# Patient Record
Sex: Female | Born: 2000 | Race: White | Hispanic: No | Marital: Single | Smoking: Never smoker
Health system: Southern US, Community
[De-identification: ages and names within clinical notes are randomized; demographics above are authoritative.]

## PROBLEM LIST (undated history)

## (undated) DIAGNOSIS — J45909 Unspecified asthma, uncomplicated: Secondary | ICD-10-CM

---
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
## 2018-04-13 ENCOUNTER — Encounter: Payer: Self-pay | Admitting: Pediatrics

## 2018-04-13 ENCOUNTER — Ambulatory Visit (INDEPENDENT_AMBULATORY_CARE_PROVIDER_SITE_OTHER): Payer: Medicaid Other | Admitting: Pediatrics

## 2018-04-13 VITALS — BP 116/83 | HR 77 | Ht 63.48 in | Wt 158.0 lb

## 2018-04-13 DIAGNOSIS — N898 Other specified noninflammatory disorders of vagina: Secondary | ICD-10-CM | POA: Diagnosis not present

## 2018-04-13 DIAGNOSIS — K591 Functional diarrhea: Secondary | ICD-10-CM | POA: Diagnosis not present

## 2018-04-13 DIAGNOSIS — Z3041 Encounter for surveillance of contraceptive pills: Secondary | ICD-10-CM

## 2018-04-13 DIAGNOSIS — Z113 Encounter for screening for infections with a predominantly sexual mode of transmission: Secondary | ICD-10-CM | POA: Diagnosis not present

## 2018-04-13 NOTE — Progress Notes (Signed)
History was provided by the patient.  Madison Schmitt is a 17 y.o. female who is here for vaginal pain.   PCP confirmed? Yes.    Michiel Sites, MD  HPI:  Pain and bleeding during sex. Having some vaginal odor. Pain is deep on the inside. It occurs sometimes. Bleeding is also sometimes. Last time treated for a yeast infection things got better. Denies siginificant discharge or itching. Using condoms sometimes. Denies pain with urination or frequency. Has been starting her periods somewhat earlier before her placebo and is lasting 4-5 days. She has been using Diva cup. Cleans with soap and water at the end of her cycle. Using hand soap.   Been having a spell of diarrhea. She is going about 4 times a day. It has been going on for about a week now. She had the same thing in the beginning of the year that lasted over 1 week. Nothing seems to make it better or worse. She has gas pain right before and then it is just a little bit but feels urgent. It is totally liquid. It is now getting more solid. Denies any nausea but had some vomiting one month ago after eating some seafood. Denies blood in stools. They are a medium brown in color.   Review of Systems  Constitutional: Negative for malaise/fatigue.  Eyes: Negative for double vision.  Respiratory: Negative for shortness of breath.   Cardiovascular: Negative for chest pain and palpitations.  Gastrointestinal: Positive for diarrhea. Negative for abdominal pain, constipation, nausea and vomiting.  Genitourinary: Negative for dysuria, frequency and urgency.  Musculoskeletal: Negative for joint pain and myalgias.  Skin: Negative for rash.  Neurological: Negative for dizziness and headaches.  Endo/Heme/Allergies: Does not bruise/bleed easily.     Patient Active Problem List   Diagnosis Date Noted  . Oral contraceptive pill surveillance 07/28/2014  . Dysmenorrhea treated with oral contraceptive 07/28/2014  . Headache 07/15/2014  . Menorrhagia with  irregular cycle 07/09/2014    Current Outpatient Medications on File Prior to Visit  Medication Sig Dispense Refill  . albuterol (PROVENTIL HFA;VENTOLIN HFA) 108 (90 BASE) MCG/ACT inhaler Inhale into the lungs every 6 (six) hours as needed for wheezing or shortness of breath. 1 Inhaler 2  . levonorgestrel-ethinyl estradiol (AVIANE) 0.1-20 MG-MCG tablet Take 1 tablet by mouth daily. 4 Package 3  . fluconazole (DIFLUCAN) 150 MG tablet Take 1 tablet (150 mg total) by mouth daily. (Patient not taking: Reported on 04/13/2018) 2 tablet 0  . ibuprofen (ADVIL,MOTRIN) 600 MG tablet Take 1 tablet (600 mg total) by mouth every 6 (six) hours as needed for fever or mild pain. (Patient not taking: Reported on 04/13/2018) 30 tablet 0  . loratadine (CLARITIN) 10 MG tablet Take 10 mg by mouth as needed.     . mupirocin ointment (BACTROBAN) 2 % Place 1 application into the nose 2 (two) times daily.     No current facility-administered medications on file prior to visit.     Allergies  Allergen Reactions  . Amoxicillin Hives    Physical Exam:    Vitals:   04/13/18 0920  BP: 116/83  Pulse: 77  Weight: 158 lb (71.7 kg)  Height: 5' 3.48" (1.612 m)    Blood pressure percentiles are not available for patients who are 18 years or older. No LMP recorded.  Physical Exam  Constitutional: She appears well-developed. No distress.  HENT:  Mouth/Throat: Oropharynx is clear and moist.  Neck: No thyromegaly present.  Cardiovascular: Normal rate and regular rhythm.  No murmur heard. Pulmonary/Chest: Breath sounds normal.  Abdominal: Soft. Bowel sounds are normal. She exhibits no distension and no mass. There is no tenderness. There is no guarding.  Musculoskeletal: She exhibits no edema.  Lymphadenopathy:    She has no cervical adenopathy.  Neurological: She is alert.  Skin: Skin is warm. No rash noted.  Psychiatric: She has a normal mood and affect.  Nursing note and vitals reviewed.     Assessment/Plan: 1. Oral contraceptive pill surveillance Doing well with OCPs.   2. Vaginal odor Discussed that she may have yeast vs. BV. Low suspicion for STI. Will get swabs today and call with results. If STI positive or bleeding/pain doesn't clear with treatment of BV, discussed I would like to do pelvic to examine further vs. Doing one today. She agreed to return for pelvic if needed. Discussed cleaning diva cup in boiling water instead of using hand soap which may impact vaginal health.   3. Routine screening for STI (sexually transmitted infection) Per protocol.  - C. trachomatis/N. gonorrhoeae RNA - WET PREP BY MOLECULAR PROBE  4. Diarrhea  Given lack of other sx, likely not infectious. Discussed the episode where she vomited after eating crab once and tuna steak a later time- discussed monitoring reactions when she eats fish. Given that stools are slowly improving, will just continue to monitor for now and return if worsening or blood present.

## 2018-04-14 ENCOUNTER — Other Ambulatory Visit: Payer: Self-pay | Admitting: Pediatrics

## 2018-04-14 ENCOUNTER — Encounter: Payer: Self-pay | Admitting: Pediatrics

## 2018-04-14 DIAGNOSIS — B9689 Other specified bacterial agents as the cause of diseases classified elsewhere: Secondary | ICD-10-CM

## 2018-04-14 DIAGNOSIS — N760 Acute vaginitis: Principal | ICD-10-CM

## 2018-04-14 LAB — C. TRACHOMATIS/N. GONORRHOEAE RNA
C. TRACHOMATIS RNA, TMA: NOT DETECTED
N. gonorrhoeae RNA, TMA: NOT DETECTED

## 2018-04-14 LAB — WET PREP BY MOLECULAR PROBE
CANDIDA SPECIES: NOT DETECTED
MICRO NUMBER:: 90835055
SPECIMEN QUALITY:: ADEQUATE
TRICHOMONAS VAG: NOT DETECTED

## 2018-04-14 MED ORDER — METRONIDAZOLE 500 MG PO TABS: 500.0000 mg | ORAL_TABLET | Freq: Two times a day (BID) | ORAL | 0 refills | Status: AC

## 2018-04-15 ENCOUNTER — Encounter: Payer: Self-pay | Admitting: Pediatrics

## 2018-09-28 ENCOUNTER — Other Ambulatory Visit: Payer: Self-pay | Admitting: Pediatrics

## 2018-09-28 DIAGNOSIS — N921 Excessive and frequent menstruation with irregular cycle: Secondary | ICD-10-CM

## 2018-09-28 MED ORDER — LEVONORGESTREL-ETHINYL ESTRAD 0.1-20 MG-MCG PO TABS: 1.0000 | ORAL_TABLET | Freq: Every day | ORAL | 3 refills | Status: DC

## 2018-11-26 ENCOUNTER — Emergency Department (HOSPITAL_BASED_OUTPATIENT_CLINIC_OR_DEPARTMENT_OTHER)
Admission: EM | Admit: 2018-11-26 | Discharge: 2018-11-26 | Disposition: A | Discharge status: Home / Self Care | Payer: Medicaid Other | Attending: Emergency Medicine | Admitting: Emergency Medicine

## 2018-11-26 ENCOUNTER — Encounter (HOSPITAL_BASED_OUTPATIENT_CLINIC_OR_DEPARTMENT_OTHER): Payer: Self-pay | Admitting: Emergency Medicine

## 2018-11-26 ENCOUNTER — Emergency Department (HOSPITAL_BASED_OUTPATIENT_CLINIC_OR_DEPARTMENT_OTHER): Payer: Medicaid Other

## 2018-11-26 ENCOUNTER — Other Ambulatory Visit: Payer: Self-pay

## 2018-11-26 DIAGNOSIS — Z3202 Encounter for pregnancy test, result negative: Secondary | ICD-10-CM | POA: Insufficient documentation

## 2018-11-26 DIAGNOSIS — J45909 Unspecified asthma, uncomplicated: Secondary | ICD-10-CM | POA: Diagnosis not present

## 2018-11-26 DIAGNOSIS — Y929 Unspecified place or not applicable: Secondary | ICD-10-CM | POA: Diagnosis not present

## 2018-11-26 DIAGNOSIS — S39012A Strain of muscle, fascia and tendon of lower back, initial encounter: Secondary | ICD-10-CM | POA: Insufficient documentation

## 2018-11-26 DIAGNOSIS — S3992XA Unspecified injury of lower back, initial encounter: Secondary | ICD-10-CM | POA: Diagnosis present

## 2018-11-26 DIAGNOSIS — Y939 Activity, unspecified: Secondary | ICD-10-CM | POA: Diagnosis not present

## 2018-11-26 DIAGNOSIS — Y999 Unspecified external cause status: Secondary | ICD-10-CM | POA: Insufficient documentation

## 2018-11-26 DIAGNOSIS — Z79899 Other long term (current) drug therapy: Secondary | ICD-10-CM | POA: Diagnosis not present

## 2018-11-26 DIAGNOSIS — X58XXXA Exposure to other specified factors, initial encounter: Secondary | ICD-10-CM | POA: Diagnosis not present

## 2018-11-26 LAB — URINALYSIS, ROUTINE W REFLEX MICROSCOPIC
Bilirubin Urine: NEGATIVE
GLUCOSE, UA: NEGATIVE mg/dL
Ketones, ur: NEGATIVE mg/dL
Leukocytes,Ua: NEGATIVE
NITRITE: NEGATIVE
Protein, ur: NEGATIVE mg/dL
pH: 6.5 (ref 5.0–8.0)

## 2018-11-26 LAB — URINALYSIS, MICROSCOPIC (REFLEX)

## 2018-11-26 LAB — PREGNANCY, URINE: Preg Test, Ur: NEGATIVE

## 2018-11-26 MED ORDER — DIAZEPAM 5 MG PO TABS: 5.0000 mg | ORAL_TABLET | Freq: Two times a day (BID) | ORAL | 0 refills | Status: DC

## 2018-11-26 MED ORDER — KETOROLAC TROMETHAMINE 30 MG/ML IJ SOLN
30.0000 mg | Freq: Once | INTRAMUSCULAR | Status: AC
  Administered 2018-11-26: 30 mg via INTRAMUSCULAR
  Filled 2018-11-26: qty 1

## 2018-11-26 NOTE — ED Provider Notes (Signed)
[facility] EMERGENCY DEPARTMENT Provider Note   CSN: [id number] Arrival date & time: 11/26/18  1051    History   Chief Complaint Chief Complaint  Patient presents with  . Back Pain    HPI Madison Schmitt is a 18 y.o. female.     Pt presents to the ED today with back pain.  Pt said it has settled in her low back on both sides, right more than left.  No urinary sx.  No pain or numbness in legs.  No trauma.  She has taken tylenol and ibuprofen for sx without relief.  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     Past Medical History:  Diagnosis Date  . Asthma    seasonal     Patient Active Problem List   Diagnosis Date Noted  . Oral contraceptive pill surveillance 07/28/2014  . Dysmenorrhea treated with oral contraceptive 07/28/2014  . Headache 07/15/2014  . Menorrhagia with irregular cycle 07/09/2014    History reviewed. No pertinent surgical history.   OB History   No obstetric history on file.      Home Medications    Prior to Admission medications   Medication Sig Start Date End Date Taking? Authorizing Provider  albuterol (PROVENTIL HFA;VENTOLIN HFA) 108 (90 BASE) MCG/ACT inhaler Inhale into the lungs every 6 (six) hours as needed for wheezing or shortness of breath. 07/15/14   Owens Shark, MD  diazepam (VALIUM) 5 MG tablet Take 1 tablet (5 mg total) by mouth 2 (two) times daily. 11/26/18   Jacalyn Lefevre, MD  levonorgestrel-ethinyl estradiol (AVIANE) 0.1-20 MG-MCG tablet Take 1 tablet by mouth daily. 09/28/18   Verneda Skill, FNP  loratadine (CLARITIN) 10 MG tablet Take 10 mg by mouth as needed.     [provider]  mupirocin ointment (BACTROBAN) 2 % Place 1 application into the nose 2 (two) times daily.    [provider]    Family History Family History  Problem Relation Age of Onset  . Anxiety disorder Mother   . Diabetes Mother     Social History Social History    Tobacco Use  . Smoking status: Never Smoker  . Smokeless tobacco: Never Used  Substance Use Topics  . Alcohol use: No    Alcohol/week: 0.0 standard drinks  . Drug use: Never     Allergies   Amoxicillin   Review of Systems Review of Systems  Musculoskeletal: Positive for back pain.  All other systems reviewed and are negative.    Physical Exam Updated Vital Signs BP 135/86 (BP Location: Left Arm)   Pulse 89   Temp 98.1 F (36.7 C) (Oral)   Resp 20   Ht 5\' 3"  (1.6 m)   Wt 75.3 kg   LMP 11/26/2018   SpO2 95%   BMI 29.41 kg/m   Physical Exam Vitals signs and nursing note reviewed.  Constitutional:      Appearance: Normal appearance.  HENT:     Head: Normocephalic and atraumatic.     Right Ear: External ear normal.     Left Ear: External ear normal.     Nose: Nose normal.     Mouth/Throat:     Mouth: Mucous membranes are moist.  Eyes:     Pupils: Pupils are equal, round, and reactive to light.  Neck:     Musculoskeletal: Normal range of motion and neck supple.  Cardiovascular:     Rate  and Rhythm: Normal rate and regular rhythm.     Pulses: Normal pulses.     Heart sounds: Normal heart sounds.  Pulmonary:     Effort: Pulmonary effort is normal.     Breath sounds: Normal breath sounds.  Abdominal:     General: Abdomen is flat.     Palpations: Abdomen is soft.  Musculoskeletal:       Arms:  Skin:    General: Skin is warm.     Capillary Refill: Capillary refill takes less than 2 seconds.  Neurological:     General: No focal deficit present.     Mental Status: She is alert and oriented to person, place, and time.  Psychiatric:        Mood and Affect: Mood normal.        Behavior: Behavior normal.      ED Treatments / Results  Labs (all labs ordered are listed, but only abnormal results are displayed) Labs Reviewed  URINALYSIS, ROUTINE W REFLEX MICROSCOPIC - Abnormal; Notable for the following components:      Result Value   Specific  Gravity, Urine <1.005 (*)    Hgb urine dipstick MODERATE (*)    All other components within normal limits  URINALYSIS, MICROSCOPIC (REFLEX) - Abnormal; Notable for the following components:   Bacteria, UA MANY (*)    All other components within normal limits  PREGNANCY, URINE    EKG None  Radiology Dg Lumbar Spine Complete  Result Date: 11/26/2018 CLINICAL DATA:  Mid and lower back pain since Saturday. EXAM: LUMBAR SPINE - COMPLETE 4+ VIEW COMPARISON:  None. FINDINGS: There is no evidence of lumbar spine fracture. Alignment is normal. Intervertebral disc spaces are maintained. IMPRESSION: Negative. Electronically Signed   By: Sherian Rein M.D.   On: 11/26/2018 12:07    Procedures Procedures (including critical care time)  Medications Ordered in ED Medications  ketorolac (TORADOL) 30 MG/ML injection 30 mg (30 mg Intramuscular Given 11/26/18 1136)     Initial Impression / Assessment and Plan / ED Course  I have reviewed the triage vital signs and the nursing notes.  Pertinent labs & imaging results that were available during my care of the patient were reviewed by me and considered in my medical decision making (see chart for details).       Sx are MSK in nature.  Toradol is helping.  She will be d/c home with valium to take as needed.  Return if worse.  F/u with pcp.  Final Clinical Impressions(s) / ED Diagnoses   Final diagnoses:  Strain of lumbar region, initial encounter    ED Discharge Orders         Ordered    diazepam (VALIUM) 5 MG tablet  2 times daily     11/26/18 1215           Jacalyn Lefevre, MD 11/26/18 1217

## 2018-11-26 NOTE — ED Triage Notes (Signed)
Reports mid back pain since Saturday without injury.  Denies dysuria, hematuria.

## 2019-04-01 ENCOUNTER — Encounter: Payer: Self-pay | Admitting: Family

## 2019-04-01 ENCOUNTER — Other Ambulatory Visit: Payer: Self-pay

## 2019-04-01 ENCOUNTER — Ambulatory Visit (INDEPENDENT_AMBULATORY_CARE_PROVIDER_SITE_OTHER): Payer: Medicaid Other | Admitting: Family

## 2019-04-01 VITALS — BP 127/88 | HR 76 | Ht 63.9 in | Wt 166.0 lb

## 2019-04-01 DIAGNOSIS — N946 Dysmenorrhea, unspecified: Secondary | ICD-10-CM | POA: Diagnosis not present

## 2019-04-01 DIAGNOSIS — F4323 Adjustment disorder with mixed anxiety and depressed mood: Secondary | ICD-10-CM

## 2019-04-01 DIAGNOSIS — Z793 Long term (current) use of hormonal contraceptives: Secondary | ICD-10-CM

## 2019-04-01 MED ORDER — SERTRALINE HCL 25 MG PO TABS: 25.0000 mg | ORAL_TABLET | Freq: Every day | ORAL | 0 refills | Status: DC

## 2019-04-01 MED ORDER — HYDROXYZINE HCL 10 MG PO TABS: 10.0000 mg | ORAL_TABLET | Freq: Three times a day (TID) | ORAL | 0 refills | Status: DC | PRN

## 2019-04-01 NOTE — Patient Instructions (Addendum)
It was nice to meet you today! Today we discussed starting two medications to help with your anxiety, nausea, and help you feel better.  Both medications have been sent to your pharmacy and you can start them today as prescribed. Take the sertraline in the morning.   Send me a My Chart message in about a week to see how you are doing. I will see you for a video visit when you return from the beach!  Take care and stay well. Here are some things that may help...   Things that can help decrease anxiety...  Apps: [employer]  Websites: InstantPositions.nl Www.socialanxietyinstitute.org  Books: Instant Help Series   04/16/2019 - video visit with me at 10:30

## 2019-04-01 NOTE — Progress Notes (Signed)
History was provided by the patient.  Madison Schmitt is a 18 y.o. female who is here for anxiety.   PCP confirmed? Yes.    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  -she has worked since she was [age] and 2 jobs since she was [age]  -last week she felt like quitting because she just couldn'Schmitt take it any more -she is open to medications, and wants to talk about this today  -her dad takes zoloft with great benefit -she leave for the beach on the 7th, returning on 17th.  -she recognizes her anxiety as sternum pain, nausea, no appetite -she denies si/hi, no cutting   -she is awaiting to find out if she is in [facility] and this has been very stressful for her   Review of Systems  Constitutional: Negative for chills, fever and weight loss.  HENT: Negative for sore throat.   Respiratory: Negative for cough and shortness of breath.   Cardiovascular: Positive for chest pain (with anxiety ) and palpitations (with anxiety ).  Gastrointestinal: Negative for abdominal pain, constipation and diarrhea.  Genitourinary: Negative for dysuria and frequency.  Musculoskeletal: Negative for joint pain and myalgias.  Skin: Negative for rash.  Neurological: Negative for dizziness, tremors and headaches.  Psychiatric/Behavioral: Negative for substance abuse and suicidal ideas. The patient is nervous/anxious.       Patient Active Problem List   Diagnosis Date Noted  . Oral contraceptive pill surveillance 07/28/2014  . Dysmenorrhea treated with oral contraceptive 07/28/2014  . Headache 07/15/2014  . Menorrhagia with irregular cycle 07/09/2014    Current Outpatient Medications on File Prior to Visit  Medication Sig Dispense Refill  . albuterol (PROVENTIL HFA;VENTOLIN HFA) 108 (90 BASE) MCG/ACT inhaler Inhale into the lungs every 6 (six) hours as needed for wheezing or shortness of breath. 1 Inhaler 2  . diazepam (VALIUM) 5 MG  tablet Take 1 tablet (5 mg total) by mouth 2 (two) times daily. 10 tablet 0  . levonorgestrel-ethinyl estradiol (AVIANE) 0.1-20 MG-MCG tablet Take 1 tablet by mouth daily. 4 Package 3  . loratadine (CLARITIN) 10 MG tablet Take 10 mg by mouth as needed.     . mupirocin ointment (BACTROBAN) 2 % Place 1 application into the nose 2 (two) times daily.     No current facility-administered medications on file prior to visit.     Allergies  Allergen Reactions  . Amoxicillin Hives    Physical Exam:    Vitals:   04/01/19 0926  BP: 127/88  Pulse: 76  Weight: 166 lb (75.3 kg)  Height: 5' 3.9" (1.623 m)   Wt Readings from Last 3 Encounters:  04/01/19 166 lb (75.3 kg)  11/26/18 166 lb (75.3 kg) (90 %, Z= 1.29)*  04/13/18 158 lb (71.7 kg) (87 %, Z= 1.13)*   * Growth percentiles are based on CDC (Girls, 2-20 Years) data.    Growth percentile SmartLinks can only be used for patients less than 614 years old. No LMP recorded.  Physical Exam Vitals signs reviewed.  Constitutional:      General: She is not in acute distress.    Appearance: Normal appearance. She is not ill-appearing.  HENT:     Head: Normocephalic.  Eyes:     Extraocular Movements: Extraocular movements intact.     Pupils: Pupils are equal, round, and reactive to light.  Neck:     Musculoskeletal: Normal range  of motion and neck supple.  Cardiovascular:     Rate and Rhythm: Normal rate and regular rhythm.     Heart sounds: No murmur.  Pulmonary:     Effort: Pulmonary effort is normal.  Musculoskeletal: Normal range of motion.  Lymphadenopathy:     Cervical: No cervical adenopathy.  Skin:    General: Skin is warm and dry.     Capillary Refill: Capillary refill takes less than 2 seconds.     Findings: No rash.  Neurological:     General: No focal deficit present.     Mental Status: She is alert.     Motor: No tremor.  Psychiatric:        Attention and Perception: Attention normal.        Mood and Affect: Mood  is anxious.        Behavior: Behavior normal.        Thought Content: Thought content normal.      Assessment/Plan: 1. Adjustment disorder with mixed anxiety and depressed mood -reviewed PHQSADS 9/15/7, elevated anxiety symptoms consistent with her HPI; reviewed SSRI use, efficacy at 4-6 weeks, BBW; since her dad uses sertraline with success, will try this agent first; also reviewed hydroxyzine use for breakthrough anxiety, nausea, and sleep improvement. Discussed not driving while taking hydroxyzine; she will check in by My Chart while at beach and we will have a video visit in 2 weeks  -return precautions given  - hydrOXYzine (ATARAX/VISTARIL) 10 MG tablet; Take 1 tablet (10 mg total) by mouth 3 (three) times daily as needed.  Dispense: 30 tablet; Refill: 0 - sertraline (ZOLOFT) 25 MG tablet; Take 1 tablet (25 mg total) by mouth daily.  Dispense: 30 tablet; Refill: 0  2. Dysmenorrhea treated with oral contraceptive -she is stable on current regimen. Reviewed that there are other options when she is ready to discuss LARC; reviewed condom use and EC as needed.

## 2019-04-03 ENCOUNTER — Encounter: Payer: Self-pay | Admitting: Family

## 2019-04-16 ENCOUNTER — Ambulatory Visit (INDEPENDENT_AMBULATORY_CARE_PROVIDER_SITE_OTHER): Payer: Medicaid Other | Admitting: Family

## 2019-04-16 ENCOUNTER — Encounter: Payer: Self-pay | Admitting: Family

## 2019-04-16 DIAGNOSIS — F4323 Adjustment disorder with mixed anxiety and depressed mood: Secondary | ICD-10-CM | POA: Diagnosis not present

## 2019-04-16 DIAGNOSIS — M62830 Muscle spasm of back: Secondary | ICD-10-CM

## 2019-04-16 MED ORDER — CYCLOBENZAPRINE HCL 10 MG PO TABS: 10.0000 mg | ORAL_TABLET | Freq: Every day | ORAL | 0 refills | Status: DC

## 2019-04-16 NOTE — Progress Notes (Signed)
Virtual Visit via Video Note  I connected with Madison Schmitt  on 04/16/19 at 10:30 AM EDT by a video enabled telemedicine application and verified that I am speaking with the correct person using two identifiers.   Location of patient/parent: in car   I discussed the limitations of evaluation and management by telemedicine and the availability of in person appointments.  I discussed that the purpose of this telehealth visit is to provide medical care while limiting exposure to the novel coronavirus.  The patient expressed understanding and agreed to proceed.  Reason for visit:  Medication follow up   History of Present Illness:  -has been taking zoloft 25 mg every morning  -leaving the beach right now -pretty reserved and quiet and not usually me; she realizes that too  -has been around cousins and is not talking, not engaged; on phone  -has been feeling good otherwise -got into program, hasn't been at work  -did not try hydroxyzine  -no back pain since not working this week; wants to trial flexeril if possible to help with back pain and sleep with work    Observations/Objective:  Smiling, well-appearing, no WOB or NAD   Assessment and Plan:  1. Adjustment disorder with mixed anxiety and depressed mood -d/c sertraline, her description is consistent with that of poor metabolizer and does not seem therapeutic; trial off medication to see if her recent reduction in stress over learning she was accepted into the program has any affect on her mood.   -may use hydroxyzine as needed for anxiety/sleep  2. Back muscle spasm -discussed efficacy of flexeril for muscle spasm; use only at bedtime and not while driving or at work; would benefit from ice/heat intermittent treatments also, 10-15 minutes each once or twice nightly; do not take flexeril more than this one Rx.  - cyclobenzaprine (FLEXERIL) 10 MG tablet; Take 1 tablet (10 mg total) by mouth at bedtime.  Dispense: 30 tablet; Refill:  0    Follow Up Instructions: 2 weeks; she will My Chart message.    I discussed the assessment and treatment plan with the patient and/or parent/guardian. They were provided an opportunity to ask questions and all were answered. They agreed with the plan and demonstrated an understanding of the instructions.   They were advised to call back or seek an in-person evaluation in the emergency room if the symptoms worsen or if the condition fails to improve as anticipated.  I spent 10 minutes on this telehealth visit inclusive of face-to-face video and care coordination time I was located off-site during this encounter.  Madison Ames, NP

## 2019-04-23 ENCOUNTER — Encounter: Payer: Self-pay | Admitting: Family

## 2019-04-27 ENCOUNTER — Encounter: Payer: Self-pay | Admitting: Family

## 2019-04-27 ENCOUNTER — Other Ambulatory Visit: Payer: Self-pay

## 2019-04-27 ENCOUNTER — Ambulatory Visit (INDEPENDENT_AMBULATORY_CARE_PROVIDER_SITE_OTHER): Payer: Medicaid Other | Admitting: Family

## 2019-04-27 DIAGNOSIS — Z0100 Encounter for examination of eyes and vision without abnormal findings: Secondary | ICD-10-CM | POA: Diagnosis not present

## 2019-04-27 DIAGNOSIS — Z01118 Encounter for examination of ears and hearing with other abnormal findings: Secondary | ICD-10-CM

## 2019-04-27 NOTE — Progress Notes (Signed)
Pt here today for vitals check. Collaborated with NP- plan of care made.Form completed and copied per protocol.

## 2019-04-27 NOTE — Progress Notes (Signed)
Wt Readings from Last 3 Encounters:  04/27/19 167 lb 3.2 oz (75.8 kg)  04/01/19 166 lb (75.3 kg)  11/26/18 166 lb (75.3 kg) (90 %, Z= 1.29)*   * Growth percentiles are based on CDC (Girls, 2-20 Years) data.   Temp Readings from Last 3 Encounters:  11/26/18 98.1 F (36.7 C) (Oral)  04/06/15 98.5 F (36.9 C) (Oral)  02/25/15 98.4 F (36.9 C) (Oral)   BP Readings from Last 3 Encounters:  04/01/19 127/88  11/26/18 135/86  04/13/18 116/83   Pulse Readings from Last 3 Encounters:  04/01/19 76  11/26/18 89  04/13/18 77    Review of Systems  Constitutional: Negative for fever, malaise/fatigue and weight loss.  HENT: Negative for ear pain, hearing loss and tinnitus.   Eyes: Negative for blurred vision and double vision.  Respiratory: Negative for cough and shortness of breath.   Cardiovascular: Negative for chest pain and palpitations.  Gastrointestinal: Negative for abdominal pain and vomiting.  Genitourinary: Negative for dysuria and frequency.  Musculoskeletal: Negative for joint pain and myalgias.  Neurological: Negative for dizziness and headaches.  Psychiatric/Behavioral: Negative for depression. The patient is not nervous/anxious.     Patient Active Problem List   Diagnosis Date Noted  . Oral contraceptive pill surveillance 07/28/2014  . Dysmenorrhea treated with oral contraceptive 07/28/2014  . Headache 07/15/2014  . Menorrhagia with irregular cycle 07/09/2014    Current Outpatient Medications on File Prior to Visit  Medication Sig Dispense Refill  . cyclobenzaprine (FLEXERIL) 10 MG tablet Take 1 tablet (10 mg total) by mouth at bedtime. 30 tablet 0  . hydrOXYzine (ATARAX/VISTARIL) 10 MG tablet Take 1 tablet (10 mg total) by mouth 3 (three) times daily as needed. 30 tablet 0  . levonorgestrel-ethinyl estradiol (AVIANE) 0.1-20 MG-MCG tablet Take 1 tablet by mouth daily. 4 Package 3  . albuterol (PROVENTIL HFA;VENTOLIN HFA) 108 (90 BASE) MCG/ACT inhaler Inhale into  the lungs every 6 (six) hours as needed for wheezing or shortness of breath. 1 Inhaler 2  . diazepam (VALIUM) 5 MG tablet Take 1 tablet (5 mg total) by mouth 2 (two) times daily. (Patient not taking: Reported on 04/27/2019) 10 tablet 0  . loratadine (CLARITIN) 10 MG tablet Take 10 mg by mouth as needed.     . mupirocin ointment (BACTROBAN) 2 % Place 1 application into the nose 2 (two) times daily.     No current facility-administered medications on file prior to visit.     Allergies  Allergen Reactions  . Amoxicillin Hives    Physical Exam:    Vitals:   04/27/19 1524  BP: 129/89  Pulse: 98  Weight: 167 lb 3.2 oz (75.8 kg)  Height: 5' 3.19" (1.605 m)    Growth percentile SmartLinks can only be used for patients less than 20 years old. No LMP recorded.  Physical Exam Constitutional:      Appearance: Normal appearance.  HENT:     Head: Normocephalic.     Right Ear: Tympanic membrane and ear canal normal.     Left Ear: Tympanic membrane and ear canal normal.     Nose: Nose normal.     Mouth/Throat:     Mouth: Mucous membranes are moist.     Pharynx: No oropharyngeal exudate.  Eyes:     Extraocular Movements: Extraocular movements intact.     Pupils: Pupils are equal, round, and reactive to light.  Neck:     Musculoskeletal: Normal range of motion.  Cardiovascular:  Rate and Rhythm: Normal rate and regular rhythm.     Heart sounds: No murmur.  Pulmonary:     Effort: Pulmonary effort is normal.  Musculoskeletal: Normal range of motion.        General: No swelling.  Lymphadenopathy:     Cervical: No cervical adenopathy.  Skin:    General: Skin is warm and dry.     Findings: No rash.  Neurological:     General: No focal deficit present.     Mental Status: She is alert and oriented to person, place, and time.  Psychiatric:        Mood and Affect: Mood normal.     Assessment/Plan: 1. Encounter for hearing screening with abnormal findings  -will recheck hearing  at next follow up. Forms completed and given to patient.   2. Encounter for vision examination without abnormal findings -normal vision screening

## 2019-06-30 ENCOUNTER — Encounter: Payer: Self-pay | Admitting: Pediatrics

## 2019-06-30 ENCOUNTER — Ambulatory Visit (INDEPENDENT_AMBULATORY_CARE_PROVIDER_SITE_OTHER): Payer: Medicaid Other | Admitting: Pediatrics

## 2019-06-30 VITALS — BP 128/96 | HR 100 | Ht 63.19 in | Wt 168.4 lb

## 2019-06-30 DIAGNOSIS — N921 Excessive and frequent menstruation with irregular cycle: Secondary | ICD-10-CM

## 2019-06-30 DIAGNOSIS — L708 Other acne: Secondary | ICD-10-CM

## 2019-06-30 DIAGNOSIS — F4322 Adjustment disorder with anxiety: Secondary | ICD-10-CM

## 2019-06-30 DIAGNOSIS — Z3041 Encounter for surveillance of contraceptive pills: Secondary | ICD-10-CM | POA: Diagnosis not present

## 2019-06-30 DIAGNOSIS — Z793 Long term (current) use of hormonal contraceptives: Secondary | ICD-10-CM

## 2019-06-30 DIAGNOSIS — I10 Essential (primary) hypertension: Secondary | ICD-10-CM

## 2019-06-30 DIAGNOSIS — N946 Dysmenorrhea, unspecified: Secondary | ICD-10-CM | POA: Diagnosis not present

## 2019-06-30 MED ORDER — CLINDAMYCIN PHOS-BENZOYL PEROX 1-5 % EX GEL: CUTANEOUS | 11 refills | Status: DC

## 2019-06-30 MED ORDER — LEVONORGESTREL-ETHINYL ESTRAD 0.1-20 MG-MCG PO TABS: 1.0000 | ORAL_TABLET | Freq: Every day | ORAL | 3 refills | Status: DC

## 2019-06-30 NOTE — Progress Notes (Signed)
THIS RECORD MAY CONTAIN CONFIDENTIAL INFORMATION THAT SHOULD NOT BE RELEASED WITHOUT REVIEW OF THE SERVICE PROVIDER.  Virtual Follow-Up Visit via Video Note  I connected with Madison Schmitt 's patient  on 06/30/19 at  1:30 PM EDT by a video enabled telemedicine application and verified that I am speaking with the correct person using two identifiers.    This patient visit was completed through the use of an audio/video or telephone encounter in the setting of the State of Emergency due to the COVID-19 Pandemic.  I discussed that the purpose of this telehealth visit is to provide medical care while limiting exposure to the novel coronavirus.       I discussed the limitations of evaluation and management by telemedicine and the availability of in person appointments.    The patient expressed understanding and agreed to proceed.   The patient was physically located in clinic in [place] or a state in which I am permitted to provide care. The patient and/or parent/guardian understood that s/he may incur co-pays and cost sharing, and agreed to the telemedicine visit. The visit was reasonable and appropriate under the circumstances given the patient's presentation at the time.   The patient and/or parent/guardian has been advised of the potential risks and limitations of this mode of treatment (including, but not limited to, the absence of in-person examination) and has agreed to be treated using telemedicine. The patient's/patient's family's questions regarding telemedicine have been answered.    As this visit was completed in an ambulatory virtual setting, the patient and/or parent/guardian has also been advised to contact their provider's office for worsening conditions, and seek emergency medical treatment and/or call 911 if the patient deems either necessary.     Madison Schmitt is a [age] y.o. female referred by [name], Candida Vetter T, FNP here today for follow-up of anxious mood, nausea and OCP  maintenance.    Growth Chart Viewed? not applicable  Previsit planning completed:  yes   History was provided by the patient.  PCP Confirmed?  yes  My Chart Activated?   yes    Plan from Last Visit:   D/c sertraline, continue to monitor   Chief Complaint: Mood changes   History of Present Illness:  She was having a lot of anxiety and uncertainty with not knowing about her program stuff. She got put on zoloft and that didn't work out for her. She got into her program and she has felt much better and not needed medication.  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 She thought she wanted to do nursing but this is great.   Nausea has resolved and was r/t mood issues.   Anxiety is 1-2 but well managed. Was trying to do some physical activity to help.   Denies headaches.   Large zit on right side of face from mask   Patient's last menstrual period was 06/09/2019.  Review of Systems  Constitutional: Negative for malaise/fatigue.  HENT: Negative for sore throat.   Eyes: Negative for double vision.  Respiratory: Negative for shortness of breath.   Cardiovascular: Negative for chest pain and palpitations.  Gastrointestinal: Negative for abdominal pain, constipation, diarrhea, nausea and vomiting.  Genitourinary: Negative for dysuria.  Musculoskeletal: Negative for joint pain and myalgias.  Skin: Negative for rash.  Neurological: Negative for dizziness and headaches.  Endo/Heme/Allergies: Does not bruise/bleed easily.  Psychiatric/Behavioral: Negative for depression. The patient is not nervous/anxious and does not have insomnia.  Allergies  Allergen Reactions  . Amoxicillin Hives   Outpatient Medications Prior to Visit  Medication Sig Dispense Refill  . albuterol (PROVENTIL HFA;VENTOLIN HFA) 108 (90 BASE) MCG/ACT inhaler Inhale into the lungs every 6 (six) hours as needed for wheezing or shortness of breath. 1 Inhaler 2  . cyclobenzaprine  (FLEXERIL) 10 MG tablet Take 1 tablet (10 mg total) by mouth at bedtime. 30 tablet 0  . hydrOXYzine (ATARAX/VISTARIL) 10 MG tablet Take 1 tablet (10 mg total) by mouth 3 (three) times daily as needed. 30 tablet 0  . levonorgestrel-ethinyl estradiol (AVIANE) 0.1-20 MG-MCG tablet Take 1 tablet by mouth daily. 4 Package 3  . mupirocin ointment (BACTROBAN) 2 % Place 1 application into the nose 2 (two) times daily.    . diazepam (VALIUM) 5 MG tablet Take 1 tablet (5 mg total) by mouth 2 (two) times daily. (Patient not taking: Reported on 04/27/2019) 10 tablet 0  . loratadine (CLARITIN) 10 MG tablet Take 10 mg by mouth as needed.      No facility-administered medications prior to visit.      Patient Active Problem List   Diagnosis Date Noted  . Oral contraceptive pill surveillance 07/28/2014  . Dysmenorrhea treated with oral contraceptive 07/28/2014  . Headache 07/15/2014  . Menorrhagia with irregular cycle 07/09/2014    Past Medical History:  Reviewed and updated?  yes Past Medical History:  Diagnosis Date  . Asthma    seasonal     Family History: Reviewed and updated? yes Family History  Problem Relation Age of Onset  . Anxiety disorder Mother   . Diabetes Mother     Confidentiality was discussed with the patient and if applicable, with caregiver as well.  Enter confidential phone number in Family Comments section of [employer] Tobacco?  no Drugs/ETOH?  no Partner preference?  female Sexually Active?  no  Pregnancy Prevention:  birth control pills, reviewed condoms & plan B Trauma currently or in the pastt?  no Suicidal or Self-Harm thoughts?   no  The following portions of the patient's history were reviewed and updated as appropriate: allergies, current medications, past family history, past medical history, past social history, past surgical history and problem list.  Visual Observations/Objective:   General Appearance: Well nourished well developed, in no apparent distress.   Eyes: conjunctiva no swelling or erythema ENT/Mouth: No hoarseness, No cough for duration of visit.  Neck: Supple  Respiratory: Respiratory effort normal, normal rate, no retractions or distress.   Cardio: Appears well-perfused, noncyanotic Musculoskeletal: no obvious deformity Skin: visible skin with a large raised, red lesion to R cheek.  Neuro: Awake and oriented X 3,  Psych:  normal affect, Insight and Judgment appropriate.    Assessment/Plan: 1. Menorrhagia with irregular cycle Continue OCP as prescribed. This has been going well for her.  - levonorgestrel-ethinyl estradiol (AVIANE) 0.1-20 MG-MCG tablet; Take 1 tablet by mouth daily.  Dispense: 4 Package; Refill: 3  2. Dysmenorrhea treated with oral contraceptive As above.   3. Oral contraceptive pill surveillance BP has been elevated in the past  4. Other acne Large lesion to R cheek. Discussed starting benzaclin tonight and if not improved in a few days to let me know by mychart as she may need a PO antibiotic based on how large and swollen the area is.  - clindamycin-benzoyl peroxide (BENZACLIN) gel; Apply topically every morning.  Dispense: 50 g; Refill: 11  5. Elevated BP  Blood pressure continues to be elevated on checks in the office.  I have asked her to do some serial checks at home so we can get an idea of where it is not in the office. May need additional labs and discussion of lifestyle changes +/- medication therapy.   I discussed the assessment and treatment plan with the patient and/or parent/guardian.  They were provided an opportunity to ask questions and all were answered.  They agreed with the plan and demonstrated an understanding of the instructions. They were advised to call back or seek an in-person evaluation in the emergency room if the symptoms worsen or if the condition fails to improve as anticipated.   Follow-up:   May for 18 yo PE and PAP or sooner as needed   Medical decision-making:   I  spent 25 minutes on this telehealth visit inclusive of face-to-face video and care coordination time I was located off site during this encounter.   Alfonso Ramus, FNP    CC: Verneda Skill, FNP, Verneda Skill, FNP

## 2019-07-19 ENCOUNTER — Other Ambulatory Visit: Payer: Self-pay

## 2019-07-19 ENCOUNTER — Encounter: Payer: Self-pay | Admitting: Pediatrics

## 2019-07-19 ENCOUNTER — Ambulatory Visit (INDEPENDENT_AMBULATORY_CARE_PROVIDER_SITE_OTHER): Payer: Medicaid Other | Admitting: Pediatrics

## 2019-07-19 VITALS — BP 133/94 | HR 98 | Ht 63.39 in | Wt 168.4 lb

## 2019-07-19 DIAGNOSIS — I10 Essential (primary) hypertension: Secondary | ICD-10-CM | POA: Diagnosis not present

## 2019-07-19 DIAGNOSIS — Z111 Encounter for screening for respiratory tuberculosis: Secondary | ICD-10-CM

## 2019-07-19 DIAGNOSIS — L700 Acne vulgaris: Secondary | ICD-10-CM

## 2019-07-19 MED ORDER — CLINDAMYCIN PHOS-BENZOYL PEROX 1.2-5 % EX GEL: 1.0000 "application " | Freq: Two times a day (BID) | CUTANEOUS | 0 refills | Status: DC

## 2019-07-19 MED ORDER — TRETINOIN 0.01 % EX GEL: Freq: Every day | CUTANEOUS | 0 refills | Status: DC

## 2019-07-19 NOTE — Progress Notes (Addendum)
THIS RECORD MAY CONTAIN CONFIDENTIAL INFORMATION THAT SHOULD NOT BE RELEASED WITHOUT REVIEW OF THE SERVICE PROVIDER.  Adolescent Medicine Consultation Follow-Up Visit Madison Schmitt  is a 18 y.o. female referred by Trude Mcburney, FNP here today for follow-up regarding hypertension follow up.    Plan at last adolescent specialty clinic  visit included -- home BP monitoring, return for labs.  Pertinent Labs? No Growth Chart Viewed? not applicable   History was provided by the patient.  Interpreter? no  Chief Complaint  Patient presents with  . Follow-up  . Depression    HPI:   PCP Confirmed?  yes  My Chart Activated?   yes  Patient's personal or confidential phone number: not obtained  Previously on sertraline for depressed mood -- no benefit. Discontinued 04/16/19.  Here today for hypertension follow up.   Hypertension: Since last visit, has been going on 30 min walk several times per week and reduced intake of high salt foods. Home BP log provided:     Fam Hx of hypertension in father, grandparents on both sides. Sibling being worked up for HTN. Mother with T2DM.  Denies EtOH, smoking, NSAID, nighttime snoring or daytime fatigue. No changes in vision, abdominal pain, vomiting, diarrhea, changes in urine volume or color.   Mood has been good. Denies SI, HI, significant stress or anxiety.  Acne: Continues to have a zit on her R cheek. Zit popped, now not getting better or worse.Benzaclin ordered at last visit, unable to pick up from pharmacy, told there was a problem on [facility]'s end.  No LMP recorded. Allergies  Allergen Reactions  . Amoxicillin Hives   Current Outpatient Medications on File Prior to Visit  Medication Sig Dispense Refill  . cyclobenzaprine (FLEXERIL) 10 MG tablet Take 1 tablet (10 mg total) by mouth at bedtime. 30 tablet 0  . levonorgestrel-ethinyl estradiol (AVIANE) 0.1-20 MG-MCG tablet Take 1 tablet by mouth daily. 4 Package 3  . albuterol  (PROVENTIL HFA;VENTOLIN HFA) 108 (90 BASE) MCG/ACT inhaler Inhale into the lungs every 6 (six) hours as needed for wheezing or shortness of breath. 1 Inhaler 2  . clindamycin-benzoyl peroxide (BENZACLIN) gel Apply topically every morning. (Patient not taking: Reported on 07/19/2019) 50 g 11  . hydrOXYzine (ATARAX/VISTARIL) 10 MG tablet Take 1 tablet (10 mg total) by mouth 3 (three) times daily as needed. (Patient not taking: Reported on 07/19/2019) 30 tablet 0  . mupirocin ointment (BACTROBAN) 2 % Place 1 application into the nose 2 (two) times daily.     No current facility-administered medications on file prior to visit.     Patient Active Problem List   Diagnosis Date Noted  . Hypertension 07/19/2019  . Oral contraceptive pill surveillance 07/28/2014  . Dysmenorrhea treated with oral contraceptive 07/28/2014  . Headache 07/15/2014  . Menorrhagia with irregular cycle 07/09/2014    Social History: Changes with school since last visit?  no  Lifestyle habits that can impact QOL: As above  Confidentiality was discussed with the patient and if applicable, with caregiver as well.  Suicidal or homicidal thoughts?   no Self injurious behaviors?  No  The following portions of the patient's history were reviewed and updated as appropriate: allergies, current medications, past family history, past medical history, past social history, past surgical history and problem list.  Physical Exam:  Vitals:   07/19/19 0854  BP: (!) 133/94  Pulse: 98  Weight: 168 lb 6.4 oz (76.4 kg)  Height: 5' 3.39" (1.61 m)   BP (!) 133/94  Pulse 98   Ht 5' 3.39" (1.61 m)   Wt 168 lb 6.4 oz (76.4 kg)   BMI 29.47 kg/m  Body mass index: body mass index is 29.47 kg/m. Growth percentile SmartLinks can only be used for patients less than 64 years old.   Physical Exam Constitutional:      General: She is not in acute distress.    Appearance: Normal appearance.  HENT:     Head: Normocephalic and  atraumatic.     Right Ear: External ear normal.     Left Ear: External ear normal.     Nose: Nose normal. No congestion.     Mouth/Throat:     Mouth: Mucous membranes are moist.     Pharynx: No oropharyngeal exudate or posterior oropharyngeal erythema.     Comments: No tonsila hypertrophy Eyes:     Extraocular Movements: Extraocular movements intact.  Neck:     Musculoskeletal: Normal range of motion and neck supple. No muscular tenderness.     Comments: No masses Cardiovascular:     Rate and Rhythm: Normal rate and regular rhythm.     Pulses: Normal pulses.     Heart sounds: Normal heart sounds. No murmur. No gallop.   Pulmonary:     Effort: Pulmonary effort is normal.     Breath sounds: Normal breath sounds.  Abdominal:     General: Abdomen is flat. Bowel sounds are normal.     Palpations: Abdomen is soft.  Musculoskeletal: Normal range of motion.        General: No swelling or tenderness.  Lymphadenopathy:     Cervical: No cervical adenopathy.  Skin:    General: Skin is warm and dry.     Capillary Refill: Capillary refill takes less than 2 seconds.  Neurological:     General: No focal deficit present.     Mental Status: She is alert and oriented to person, place, and time.  Psychiatric:        Mood and Affect: Mood normal.        Behavior: Behavior normal.        Thought Content: Thought content normal.        Judgment: Judgment normal.    Assessment/Plan:  1. Hypertension, unspecified type Likely primary HTN. Persistent at home on daily monitoring, as above. Asymptomatic, PE benign. Pt prefers to hold off on medications today, will continue exercise (walking) and start on DASH diet. Does take estrogen containing OCP, but has controlled dysmenorrhea for several years and prefers not to switch. If lab abnormalities or HTN resistant to lifestyle modification, pt understands that starting medication may be necessary. - Comprehensive metabolic panel - HDL cholesterol -  Hemoglobin A1c - Cholesterol, total - Lipid panel - TSH - T4, free - VITAMIN D 25 Hydroxy (Vit-D Deficiency, Fractures) - Urine Microscopic - Protein / creatinine ratio, urine - CBC - POCT Glucose (Device for Home Use) - EKG 12-Lead - QuantiFERON-TB Gold Plus  2. Tuberculosis screening Requested for work  3. Acne vulgaris As above. - tretinoin (RETIN-A) 0.01 % gel; Apply topically at bedtime.  Dispense: 45 g; Refill: 0  Follow-up:  No follow-ups on file.   Medical decision-making:  >20 minutes spent face to face with patient with more than 50% of appointment spent discussing diagnosis, management, follow-up, and reviewing of hypertension.

## 2019-07-19 NOTE — Patient Instructions (Addendum)
Please call [phone] to schedule an ECG. Please continue to monitor and record your home BP once per week. We will call to discuss your lab results if there are any abnormalities.

## 2019-07-19 NOTE — Progress Notes (Signed)
I have reviewed the resident's note and plan of care and helped develop the plan as necessary.  Madison Schmitt has an extensive family history of hypertension. Her sister is also going through a workup for hypertension now, so it is likely that in the near future she will require medication management, however, I support her desire to try lifestyle management first. We will follow up in 3 months pending labs.   Jonathon Resides, FNP

## 2019-07-20 LAB — COMPREHENSIVE METABOLIC PANEL
AG Ratio: 1.7 (calc) (ref 1.0–2.5)
ALT: 12 U/L (ref 6–29)
AST: 14 U/L (ref 10–30)
Albumin: 4.5 g/dL (ref 3.6–5.1)
Alkaline phosphatase (APISO): 54 U/L (ref 31–125)
BUN: 8 mg/dL (ref 7–25)
CO2: 25 mmol/L (ref 20–32)
Calcium: 9.7 mg/dL (ref 8.6–10.2)
Chloride: 105 mmol/L (ref 98–110)
Creat: 0.66 mg/dL (ref 0.50–1.10)
Globulin: 2.7 g/dL (calc) (ref 1.9–3.7)
Glucose, Bld: 120 mg/dL — ABNORMAL HIGH (ref 65–99)
Potassium: 4.6 mmol/L (ref 3.5–5.3)
Sodium: 139 mmol/L (ref 135–146)
Total Bilirubin: 0.4 mg/dL (ref 0.2–1.2)
Total Protein: 7.2 g/dL (ref 6.1–8.1)

## 2019-07-20 LAB — HEMOGLOBIN A1C
Hgb A1c MFr Bld: 5.2 % of total Hgb (ref <5.7)
Mean Plasma Glucose: 103 (calc)
eAG (mmol/L): 5.7 (calc)

## 2019-07-20 LAB — URINALYSIS, MICROSCOPIC ONLY
Bacteria, UA: NONE SEEN /HPF
Hyaline Cast: NONE SEEN /LPF
RBC / HPF: NONE SEEN /HPF (ref 0–2)
Squamous Epithelial / HPF: NONE SEEN /HPF (ref <=5)
WBC, UA: NONE SEEN /HPF (ref 0–5)

## 2019-07-20 LAB — CBC
HCT: 43.9 % (ref 35.0–45.0)
Hemoglobin: 15.2 g/dL (ref 11.7–15.5)
MCH: 33.9 pg — ABNORMAL HIGH (ref 27.0–33.0)
MCHC: 34.6 g/dL (ref 32.0–36.0)
MCV: 97.8 fL (ref 80.0–100.0)
MPV: 10 fL (ref 7.5–12.5)
Platelets: 240 10*3/uL (ref 140–400)
RBC: 4.49 10*6/uL (ref 3.80–5.10)
RDW: 12 % (ref 11.0–15.0)
WBC: 5.8 10*3/uL (ref 3.8–10.8)

## 2019-07-20 LAB — LIPID PANEL
Cholesterol: 193 mg/dL (ref <200)
HDL: 57 mg/dL (ref >=50)
LDL Cholesterol (Calc): 119 mg/dL (calc) — ABNORMAL HIGH
Non-HDL Cholesterol (Calc): 136 mg/dL (calc) — ABNORMAL HIGH (ref <130)
Total CHOL/HDL Ratio: 3.4 (calc) (ref <5.0)
Triglycerides: 78 mg/dL (ref <150)

## 2019-07-20 LAB — VITAMIN D 25 HYDROXY (VIT D DEFICIENCY, FRACTURES): Vit D, 25-Hydroxy: 23 ng/mL — ABNORMAL LOW (ref 30–100)

## 2019-07-20 LAB — PROTEIN / CREATININE RATIO, URINE
Creatinine, Urine: 30 mg/dL (ref 20–275)
Protein/Creat Ratio: 133 mg/g creat (ref 21–161)
Protein/Creatinine Ratio: 0.133 mg/mg creat (ref 0.021–0.16)
Total Protein, Urine: 4 mg/dL — ABNORMAL LOW (ref 5–24)

## 2019-07-20 LAB — TSH: TSH: 2.11 mIU/L

## 2019-07-20 LAB — T4, FREE: Free T4: 1.2 ng/dL (ref 0.8–1.4)

## 2019-07-21 LAB — QUANTIFERON-TB GOLD PLUS
Mitogen-NIL: >10 IU/mL
NIL: 0.02 IU/mL
QuantiFERON-TB Gold Plus: NEGATIVE
TB1-NIL: 0.01 IU/mL
TB2-NIL: 0.02 IU/mL

## 2019-10-20 ENCOUNTER — Other Ambulatory Visit: Payer: Self-pay

## 2019-10-20 ENCOUNTER — Telehealth (INDEPENDENT_AMBULATORY_CARE_PROVIDER_SITE_OTHER): Payer: Medicaid Other | Admitting: Pediatrics

## 2019-10-20 DIAGNOSIS — Z793 Long term (current) use of hormonal contraceptives: Secondary | ICD-10-CM | POA: Diagnosis not present

## 2019-10-20 DIAGNOSIS — I10 Essential (primary) hypertension: Secondary | ICD-10-CM

## 2019-10-20 DIAGNOSIS — N946 Dysmenorrhea, unspecified: Secondary | ICD-10-CM | POA: Diagnosis not present

## 2019-10-20 NOTE — Progress Notes (Signed)
THIS RECORD MAY CONTAIN CONFIDENTIAL INFORMATION THAT SHOULD NOT BE RELEASED WITHOUT REVIEW OF THE SERVICE PROVIDER.  Virtual Follow-Up Visit via Video Note  I connected with Madison Schmitt 's patient  on 10/20/19 at  4:00 PM EST by a video enabled telemedicine application and verified that I am speaking with the correct person using two identifiers.    This patient visit was completed through the use of an audio/video or telephone encounter in the setting of the State of Emergency due to the COVID-19 Pandemic.  I discussed that the purpose of this telehealth visit is to provide medical care while limiting exposure to the novel coronavirus.       I discussed the limitations of evaluation and management by telemedicine and the availability of in person appointments.    The patient expressed understanding and agreed to proceed.   The patient was physically located at home in [place] or a state in which I am permitted to provide care. The patient and/or parent/guardian understood that s/he may incur co-pays and cost sharing, and agreed to the telemedicine visit. The visit was reasonable and appropriate under the circumstances given the patient's presentation at the time.   The patient and/or parent/guardian has been advised of the potential risks and limitations of this mode of treatment (including, but not limited to, the absence of in-person examination) and has agreed to be treated using telemedicine. The patient's/patient's family's questions regarding telemedicine have been answered.    As this visit was completed in an ambulatory virtual setting, the patient and/or parent/guardian has also been advised to contact their provider's office for worsening conditions, and seek emergency medical treatment and/or call 911 if the patient deems either necessary.   Team Care Documentation:  Team care member assisted with documentation during this visit? no If applicable, list name(s) of team care  members and location(s) of team care members: No   Madison Schmitt is a 19 y.o. female referred by Verneda Skill, FNP here today for follow-up of hypertension, ocp.   Growth Chart Viewed? not applicable  Previsit planning completed:  yes   History was provided by the patient.  PCP Confirmed?  yes  My Chart Activated?   yes    Plan from Last Visit:   Lifestyle changes and monitor BP   Chief Complaint: BP follow up   History of Present Illness:  Been doing well. In school, feels good about her blood pressure. Not eating as much salt as previously. Was walking dog daily but has not been doing that much any more. She bought a jump rope and has been doing that for two days.   Sister who is 66 dx with HTN recently. She is about 50 lb heavier and was started on medication.   Would like to stay off meds still if possible. BP journal from weekly measurements reveals BPs usually in the 120s/90s.   Tries to drink at least a gallon a day- drinking at least 3-4 32 oz bottles a day.   Having some spotting the week before her period. Not even sig enough for a pantyliner. Periods aren't as heavy.   Using PRN flexeril once every 3 weeks.   Working at [employer].    No LMP recorded.  Review of Systems  Constitutional: Positive for malaise/fatigue.  Eyes: Negative for blurred vision and double vision.  Respiratory: Negative for shortness of breath.   Cardiovascular: Negative for chest pain and palpitations.  Gastrointestinal: Negative for abdominal pain, constipation,  diarrhea, nausea and vomiting.  Genitourinary: Negative for dysuria.  Musculoskeletal: Positive for back pain. Negative for joint pain and myalgias.  Skin: Negative for rash.  Neurological: Negative for dizziness and headaches.  Endo/Heme/Allergies: Does not bruise/bleed easily.  Psychiatric/Behavioral: Negative for depression. The patient is not nervous/anxious and does not have insomnia.      Allergies   Allergen Reactions  . Amoxicillin Hives   Outpatient Medications Prior to Visit  Medication Sig Dispense Refill  . albuterol (PROVENTIL HFA;VENTOLIN HFA) 108 (90 BASE) MCG/ACT inhaler Inhale into the lungs every 6 (six) hours as needed for wheezing or shortness of breath. 1 Inhaler 2  . Clindamycin-Benzoyl Per, Refr, gel Apply 1 application topically 2 (two) times daily. 45 g 0  . cyclobenzaprine (FLEXERIL) 10 MG tablet Take 1 tablet (10 mg total) by mouth at bedtime. 30 tablet 0  . levonorgestrel-ethinyl estradiol (AVIANE) 0.1-20 MG-MCG tablet Take 1 tablet by mouth daily. 4 Package 3  . mupirocin ointment (BACTROBAN) 2 % Place 1 application into the nose 2 (two) times daily.    Marland Kitchen tretinoin (RETIN-A) 0.01 % gel Apply topically at bedtime. 45 g 0  . clindamycin-benzoyl peroxide (BENZACLIN) gel Apply topically every morning. (Patient not taking: Reported on 07/19/2019) 50 g 11  . hydrOXYzine (ATARAX/VISTARIL) 10 MG tablet Take 1 tablet (10 mg total) by mouth 3 (three) times daily as needed. (Patient not taking: Reported on 07/19/2019) 30 tablet 0   No facility-administered medications prior to visit.     Patient Active Problem List   Diagnosis Date Noted  . Hypertension 07/19/2019  . Oral contraceptive pill surveillance 07/28/2014  . Dysmenorrhea treated with oral contraceptive 07/28/2014  . Headache 07/15/2014  . Menorrhagia with irregular cycle 07/09/2014    Past Medical History:  Reviewed and updated?  yes Past Medical History:  Diagnosis Date  . Asthma    seasonal     Family History: Reviewed and updated? yes Family History  Problem Relation Age of Onset  . Anxiety disorder Mother   . Diabetes Mother     The following portions of the patient's history were reviewed and updated as appropriate: allergies, current medications, past family history, past medical history, past social history, past surgical history and problem list.  Visual Observations/Objective:   General  Appearance: Well nourished well developed, in no apparent distress.  Eyes: conjunctiva no swelling or erythema ENT/Mouth: No hoarseness, No cough for duration of visit.  Neck: Supple  Respiratory: Respiratory effort normal, normal rate, no retractions or distress.   Cardio: Appears well-perfused, noncyanotic Musculoskeletal: no obvious deformity Skin: visible skin without rashes, ecchymosis, erythema Neuro: Awake and oriented X 3,  Psych:  normal affect, Insight and Judgment appropriate.    Assessment/Plan: 1. Hypertension, unspecified type Will CTM for now. Discussed parameters- if she is having consistent values above 130s/90s I need to know about this. She is continuing to work on moving her body although she is busy. Discussed with family history, she will likely end up on medication at some point, but ok to continue to watch for now.   2. Dysmenorrhea treated with oral contraceptive Having slight spotting the week prior to menses but not overall bothersome    I discussed the assessment and treatment plan with the patient and/or parent/guardian.  They were provided an opportunity to ask questions and all were answered.  They agreed with the plan and demonstrated an understanding of the instructions. They were advised to call back or seek an in-person evaluation in the  emergency room if the symptoms worsen or if the condition fails to improve as anticipated.   Follow-up:   3 months or sooner as needed   Medical decision-making:   I spent 15 minutes on this telehealth visit inclusive of face-to-face video and care coordination time I was located off site during this encounter.   Jonathon Resides, FNP    CC: Trude Mcburney, FNP, Trude Mcburney, FNP

## 2019-10-21 ENCOUNTER — Encounter: Payer: Self-pay | Admitting: Pediatrics

## 2019-11-22 ENCOUNTER — Other Ambulatory Visit: Payer: Self-pay | Admitting: Pediatrics

## 2019-11-22 DIAGNOSIS — H1032 Unspecified acute conjunctivitis, left eye: Secondary | ICD-10-CM

## 2019-11-22 MED ORDER — OFLOXACIN 0.3 % OP SOLN: 2.0000 [drp] | Freq: Four times a day (QID) | OPHTHALMIC | 0 refills | Status: AC

## 2019-11-22 MED ORDER — POLYMYXIN B-TRIMETHOPRIM 10000-0.1 UNIT/ML-% OP SOLN
1.00 | OPHTHALMIC | Status: DC
Start: 2019-11-21 — End: 2019-11-22

## 2019-12-02 ENCOUNTER — Other Ambulatory Visit: Payer: Self-pay | Admitting: Pediatrics

## 2019-12-03 MED ORDER — LISINOPRIL 10 MG PO TABS: 10.0000 mg | ORAL_TABLET | Freq: Every day | ORAL | 3 refills | Status: DC

## 2020-01-17 ENCOUNTER — Telehealth (INDEPENDENT_AMBULATORY_CARE_PROVIDER_SITE_OTHER): Payer: Medicaid Other | Admitting: Pediatrics

## 2020-01-17 VITALS — BP 120/84

## 2020-01-17 DIAGNOSIS — Z793 Long term (current) use of hormonal contraceptives: Secondary | ICD-10-CM

## 2020-01-17 DIAGNOSIS — N921 Excessive and frequent menstruation with irregular cycle: Secondary | ICD-10-CM

## 2020-01-17 DIAGNOSIS — F4320 Adjustment disorder, unspecified: Secondary | ICD-10-CM | POA: Diagnosis not present

## 2020-01-17 DIAGNOSIS — N946 Dysmenorrhea, unspecified: Secondary | ICD-10-CM

## 2020-01-17 DIAGNOSIS — I10 Essential (primary) hypertension: Secondary | ICD-10-CM | POA: Diagnosis not present

## 2020-01-17 NOTE — Progress Notes (Signed)
THIS RECORD MAY CONTAIN CONFIDENTIAL INFORMATION THAT SHOULD NOT BE RELEASED WITHOUT REVIEW OF THE SERVICE PROVIDER.  Virtual Follow-Up Visit via Video Note  I connected with Manmeet Arzola 's patient  on 01/17/20 at  2:30 PM EDT by a video enabled telemedicine application and verified that I am speaking with the correct person using two identifiers.   Patient/parent location: home   I discussed the limitations of evaluation and management by telemedicine and the availability of in person appointments.  I discussed that the purpose of this telehealth visit is to provide medical care while limiting exposure to the novel coronavirus.  The patient expressed understanding and agreed to proceed.   Reena Borromeo is a 19 y.o. female referred by Verneda Skill, FNP here today for follow-up of hypertension, menorrhagia.  Previsit planning completed:  yes   History was provided by the patient.  Plan from Last Visit:   Start lisinopril 10 mg daily   Chief Complaint: BP check   History of Present Illness:  Things have been good- school stressful. 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   BP is low 120s/80s. 120/84.   No headache in a long time.   Has moments where she feels like she basically dissociates when she is particularly stressed and then can't remmeber the day much when she gets home. Has talked to friends about it and sounds like an anxiety response. Is interested in talking to a therapist more about this and getting some coping techniques.   Review of Systems  Constitutional: Negative for malaise/fatigue.  Eyes: Negative for double vision.  Respiratory: Negative for shortness of breath.   Cardiovascular: Negative for chest pain and palpitations.  Gastrointestinal: Negative for abdominal pain, constipation, diarrhea, nausea and vomiting.  Genitourinary: Negative for dysuria.  Musculoskeletal: Negative for joint pain and myalgias.  Skin: Negative for rash.  Neurological:  Negative for dizziness and headaches.  Endo/Heme/Allergies: Does not bruise/bleed easily.  Psychiatric/Behavioral: Negative for depression. The patient is nervous/anxious. The patient does not have insomnia.      Allergies  Allergen Reactions  . Amoxicillin Hives   Outpatient Medications Prior to Visit  Medication Sig Dispense Refill  . albuterol (PROVENTIL HFA;VENTOLIN HFA) 108 (90 BASE) MCG/ACT inhaler Inhale into the lungs every 6 (six) hours as needed for wheezing or shortness of breath. 1 Inhaler 2  . Clindamycin-Benzoyl Per, Refr, gel Apply 1 application topically 2 (two) times daily. 45 g 0  . cyclobenzaprine (FLEXERIL) 10 MG tablet Take 1 tablet (10 mg total) by mouth at bedtime. 30 tablet 0  . levonorgestrel-ethinyl estradiol (AVIANE) 0.1-20 MG-MCG tablet Take 1 tablet by mouth daily. 4 Package 3  . lisinopril (PRINIVIL) 10 MG tablet Take 1 tablet (10 mg total) by mouth daily. 30 tablet 3  . mupirocin ointment (BACTROBAN) 2 % Place 1 application into the nose 2 (two) times daily.    Marland Kitchen tretinoin (RETIN-A) 0.01 % gel Apply topically at bedtime. 45 g 0   No facility-administered medications prior to visit.     Patient Active Problem List   Diagnosis Date Noted  . Hypertension 07/19/2019  . Oral contraceptive pill surveillance 07/28/2014  . Dysmenorrhea treated with oral contraceptive 07/28/2014  . Headache 07/15/2014  . Menorrhagia with irregular cycle 07/09/2014    The following portions of the patient's history were reviewed and updated as appropriate: allergies, current medications, past family history, past medical history, past social history, past surgical history and problem list.  Visual Observations/Objective:  General Appearance: Well nourished well developed, in no apparent distress.  Eyes: conjunctiva no swelling or erythema ENT/Mouth: No hoarseness, No cough for duration of visit.  Neck: Supple  Respiratory: Respiratory effort normal, normal rate, no  retractions or distress.   Cardio: Appears well-perfused, noncyanotic Musculoskeletal: no obvious deformity Skin: visible skin without rashes, ecchymosis, erythema Neuro: Awake and oriented X 3,  Psych:  normal affect, Insight and Judgment appropriate.    Assessment/Plan: 1. Dysmenorrhea treated with oral contraceptive Stable on ocp.   2. Menorrhagia with irregular cycle Stable on ocp.   3. Essential hypertension Continue lisinopril 10 mg. BPs have improved as have headaches.   4. Adjustment disorder, unspecified type Will refer to therapist. Discussed grounding techniques as well.  - Ambulatory referral to [facility]    I discussed the assessment and treatment plan with the patient and/or parent/guardian.  They were provided an opportunity to ask questions and all were answered.  They agreed with the plan and demonstrated an understanding of the instructions. They were advised to call back or seek an in-person evaluation in the emergency room if the symptoms worsen or if the condition fails to improve as anticipated.   Follow-up:  3 months or sooner as needed   Medical decision-making:   I spent 25 minutes on this telehealth visit inclusive of face-to-face video and care coordination time I was located in clinic during this encounter.   Jonathon Resides, FNP    CC: Trude Mcburney, FNP, Trude Mcburney, FNP

## 2020-01-20 ENCOUNTER — Encounter: Payer: Self-pay | Admitting: Pediatrics

## 2020-01-20 DIAGNOSIS — F4320 Adjustment disorder, unspecified: Secondary | ICD-10-CM | POA: Insufficient documentation

## 2020-04-09 ENCOUNTER — Other Ambulatory Visit: Payer: Self-pay | Admitting: Pediatrics

## 2020-05-13 ENCOUNTER — Other Ambulatory Visit: Payer: Self-pay | Admitting: Family

## 2020-06-19 ENCOUNTER — Other Ambulatory Visit: Payer: Self-pay | Admitting: Pediatrics

## 2020-06-19 ENCOUNTER — Other Ambulatory Visit: Payer: Self-pay | Admitting: Family

## 2020-06-19 DIAGNOSIS — N921 Excessive and frequent menstruation with irregular cycle: Secondary | ICD-10-CM

## 2020-06-19 DIAGNOSIS — M62830 Muscle spasm of back: Secondary | ICD-10-CM

## 2020-06-19 MED ORDER — LEVONORGESTREL-ETHINYL ESTRAD 0.1-20 MG-MCG PO TABS: 1.0000 | ORAL_TABLET | Freq: Every day | ORAL | 3 refills | Status: DC

## 2020-06-19 MED ORDER — LISINOPRIL 10 MG PO TABS: 10.0000 mg | ORAL_TABLET | Freq: Every day | ORAL | 1 refills | Status: DC

## 2020-06-19 MED ORDER — CYCLOBENZAPRINE HCL 10 MG PO TABS: 10.0000 mg | ORAL_TABLET | Freq: Every day | ORAL | 0 refills | Status: DC

## 2020-08-01 ENCOUNTER — Other Ambulatory Visit: Payer: Self-pay | Admitting: Pediatrics

## 2020-08-01 DIAGNOSIS — M62830 Muscle spasm of back: Secondary | ICD-10-CM

## 2020-08-01 MED ORDER — CYCLOBENZAPRINE HCL 10 MG PO TABS: 10.0000 mg | ORAL_TABLET | Freq: Every day | ORAL | 0 refills | Status: DC

## 2020-09-04 ENCOUNTER — Other Ambulatory Visit: Payer: Self-pay | Admitting: Pediatrics

## 2020-09-04 DIAGNOSIS — N921 Excessive and frequent menstruation with irregular cycle: Secondary | ICD-10-CM

## 2020-09-04 MED ORDER — LEVONORGESTREL-ETHINYL ESTRAD 0.1-20 MG-MCG PO TABS: 1.0000 | ORAL_TABLET | Freq: Every day | ORAL | 3 refills | Status: DC

## 2020-12-25 ENCOUNTER — Other Ambulatory Visit: Payer: Self-pay | Admitting: Family

## 2021-04-09 ENCOUNTER — Other Ambulatory Visit: Payer: Self-pay | Admitting: Family

## 2021-04-12 ENCOUNTER — Other Ambulatory Visit: Payer: Self-pay | Admitting: Family

## 2021-04-16 MED ORDER — LISINOPRIL 10 MG PO TABS: 10.0000 mg | ORAL_TABLET | Freq: Every day | ORAL | 0 refills | Status: DC

## 2021-04-18 ENCOUNTER — Other Ambulatory Visit: Payer: Self-pay | Admitting: Pediatrics

## 2021-04-18 MED ORDER — BENZONATATE 100 MG PO CAPS: 100.0000 mg | ORAL_CAPSULE | Freq: Four times a day (QID) | ORAL | 0 refills | Status: DC | PRN

## 2021-04-18 MED ORDER — ALBUTEROL SULFATE HFA 108 (90 BASE) MCG/ACT IN AERS: 2.0000 | INHALATION_SPRAY | Freq: Four times a day (QID) | RESPIRATORY_TRACT | 2 refills | Status: DC | PRN

## 2021-04-23 ENCOUNTER — Encounter: Payer: Self-pay | Admitting: Pediatrics

## 2021-04-23 ENCOUNTER — Other Ambulatory Visit (HOSPITAL_COMMUNITY)
Admission: RE | Admit: 2021-04-23 | Discharge: 2021-04-23 | Disposition: A | Discharge status: Home / Self Care | Payer: Medicaid Other | Source: Ambulatory Visit | Attending: Pediatrics | Admitting: Pediatrics

## 2021-04-23 ENCOUNTER — Other Ambulatory Visit: Payer: Self-pay

## 2021-04-23 ENCOUNTER — Ambulatory Visit (INDEPENDENT_AMBULATORY_CARE_PROVIDER_SITE_OTHER): Payer: Medicaid Other | Admitting: Pediatrics

## 2021-04-23 VITALS — BP 137/95 | HR 84 | Ht 63.0 in | Wt 161.2 lb

## 2021-04-23 DIAGNOSIS — Z113 Encounter for screening for infections with a predominantly sexual mode of transmission: Secondary | ICD-10-CM | POA: Insufficient documentation

## 2021-04-23 DIAGNOSIS — Z3041 Encounter for surveillance of contraceptive pills: Secondary | ICD-10-CM

## 2021-04-23 DIAGNOSIS — I10 Essential (primary) hypertension: Secondary | ICD-10-CM | POA: Diagnosis not present

## 2021-04-23 DIAGNOSIS — Z6828 Body mass index (BMI) 28.0-28.9, adult: Secondary | ICD-10-CM | POA: Diagnosis not present

## 2021-04-23 DIAGNOSIS — Z3202 Encounter for pregnancy test, result negative: Secondary | ICD-10-CM

## 2021-04-23 DIAGNOSIS — N946 Dysmenorrhea, unspecified: Secondary | ICD-10-CM

## 2021-04-23 DIAGNOSIS — Z793 Long term (current) use of hormonal contraceptives: Secondary | ICD-10-CM

## 2021-04-23 DIAGNOSIS — E559 Vitamin D deficiency, unspecified: Secondary | ICD-10-CM | POA: Insufficient documentation

## 2021-04-23 LAB — POCT URINE PREGNANCY: Preg Test, Ur: NEGATIVE

## 2021-04-23 NOTE — Progress Notes (Signed)
History was provided by the patient.  Madison Schmitt is a 20 y.o. female who is here for hypertension, dysmenorrhea.  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 Trying to work on a work life balane. Was working out consistently but hasn't been getting back in   Feeling better from illness, still mild cough but improving.   Periods are regular without issue on OCP.  Taking lisinopril 10 mg. Denies headaches.   Broke up with boyfriend yesterday-  been together 1 year but known each other 8 years. 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 Denies any mood concerns, anxiety or depression. Sleeping well.  PHQ-SADS Last 3 Score only 04/24/2021 04/03/2019  PHQ-15 Score 0 8  Total GAD-7 Score 0 15  PHQ-9 Total Score 0 7      Patient Active Problem List   Diagnosis Date Noted   Adjustment disorder 01/20/2020   Hypertension 07/19/2019   Oral contraceptive pill surveillance 07/28/2014   Dysmenorrhea treated with oral contraceptive 07/28/2014   Headache 07/15/2014    Current Outpatient Medications on File Prior to Visit  Medication Sig Dispense Refill   albuterol (PROVENTIL HFA;VENTOLIN HFA) 108 (90 BASE) MCG/ACT inhaler Inhale into the lungs every 6 (six) hours as needed for wheezing or shortness of breath. 1 Inhaler 2   albuterol (VENTOLIN HFA) 108 (90 Base) MCG/ACT inhaler Inhale 2 puffs into the lungs every 6 (six) hours as needed for wheezing or shortness of breath. 8 g 2   benzonatate (TESSALON PERLES) 100 MG capsule Take 1 capsule (100 mg total) by mouth every 6 (six) hours as needed for cough. 30 capsule 0   Clindamycin-Benzoyl Per, Refr, gel Apply 1 application topically 2 (two) times daily. 45 g 0   levonorgestrel-ethinyl estradiol (AVIANE) 0.1-20 MG-MCG tablet Take 1 tablet by mouth daily. 84 tablet 3   lisinopril (ZESTRIL) 10 MG tablet Take 1 tablet (10 mg total) by mouth daily.  90 tablet 0   cyclobenzaprine (FLEXERIL) 10 MG tablet Take 1 tablet (10 mg total) by mouth at bedtime. (Patient not taking: Reported on 04/23/2021) 30 tablet 0   mupirocin ointment (BACTROBAN) 2 % Place 1 application into the nose 2 (two) times daily.     tretinoin (RETIN-A) 0.01 % gel Apply topically at bedtime. 45 g 0   No current facility-administered medications on file prior to visit.    Allergies  Allergen Reactions   Amoxicillin Hives    Social History: Confidentiality was discussed with the patient and if applicable, with caregiver as well. Tobacco: no Secondhand smoke exposure? no Drugs/EtOH: ETOH occasionally Sexually active? yes - was with partner, now broken up  Safety: safe to self and at home  Last STI Screening:today  Pregnancy Prevention: OCP   Physical Exam:    Vitals:   04/23/21 1632  BP: (!) 137/95  Pulse: 84  Weight: 161 lb 3.2 oz (73.1 kg)  Height: 5\' 3"  (1.6 m)    Growth percentile SmartLinks can only be used for patients less than 74 years old.  Physical Exam Vitals and nursing note reviewed.  Constitutional:      General: She is not in acute distress.    Appearance: She is well-developed.  Neck:     Thyroid: No thyromegaly.  Cardiovascular:     Rate and Rhythm: Normal rate and regular rhythm.     Heart sounds: No murmur heard.  Pulmonary:     Breath sounds: Normal breath sounds.  Abdominal:     Palpations: Abdomen is soft. There is no mass.     Tenderness: There is no abdominal tenderness. There is no guarding.  Musculoskeletal:     Right lower leg: No edema.     Left lower leg: No edema.  Lymphadenopathy:     Cervical: No cervical adenopathy.  Skin:    General: Skin is warm.     Capillary Refill: Capillary refill takes less than 2 seconds.     Findings: No rash.  Neurological:     Mental Status: She is alert.     Comments: No tremor  Psychiatric:        Mood and Affect: Mood normal.    Assessment/Plan: 1. Primary  hypertension BP is not in ideal range today, however, she is motivated to get back to the gym and make additional lifestyle changes. We will leave lisinopril at current dose today and recheck BP in the ambulatory setting.  - Comprehensive metabolic panel  2. BMI 28.0-28.9,adult Labs today.  - Hemoglobin A1c - Lipid panel - TSH - T4, free  3. Oral contraceptive pill surveillance Doing well without concern.   4. Dysmenorrhea treated with oral contraceptive As above.   5. Vitamin D deficiency Will recheck today.  - VITAMIN D 25 Hydroxy (Vit-D Deficiency, Fractures)  6. Routine screening for STI (sexually transmitted infection) Per protocol.  - Urine cytology ancillary only

## 2021-04-24 ENCOUNTER — Other Ambulatory Visit: Payer: Self-pay | Admitting: Pediatrics

## 2021-04-24 LAB — URINE CYTOLOGY ANCILLARY ONLY
Chlamydia: NEGATIVE
Comment: NEGATIVE
Comment: NORMAL
Neisseria Gonorrhea: NEGATIVE

## 2021-04-24 MED ORDER — LISINOPRIL 10 MG PO TABS: 10.0000 mg | ORAL_TABLET | Freq: Every day | ORAL | 1 refills | Status: DC

## 2021-05-22 ENCOUNTER — Ambulatory Visit: Payer: Medicaid Other | Admitting: Pediatrics

## 2021-05-25 ENCOUNTER — Other Ambulatory Visit: Payer: 59

## 2021-05-25 ENCOUNTER — Other Ambulatory Visit: Payer: Self-pay

## 2021-05-31 LAB — LIPID PANEL
Cholesterol: 181 mg/dL (ref <200)
HDL: 61 mg/dL (ref >=50)
LDL Cholesterol (Calc): 104 mg/dL (calc) — ABNORMAL HIGH
Non-HDL Cholesterol (Calc): 120 mg/dL (calc) (ref <130)
Total CHOL/HDL Ratio: 3 (calc) (ref <5.0)
Triglycerides: 74 mg/dL (ref <150)

## 2021-05-31 LAB — HEMOGLOBIN A1C
Hgb A1c MFr Bld: 5.2 % of total Hgb (ref <5.7)
Mean Plasma Glucose: 103 mg/dL
eAG (mmol/L): 5.7 mmol/L

## 2021-05-31 LAB — COMPREHENSIVE METABOLIC PANEL
AG Ratio: 1.7 (calc) (ref 1.0–2.5)
ALT: 11 U/L (ref 6–29)
AST: 11 U/L (ref 10–30)
Albumin: 4.5 g/dL (ref 3.6–5.1)
Alkaline phosphatase (APISO): 54 U/L (ref 31–125)
BUN: 9 mg/dL (ref 7–25)
CO2: 25 mmol/L (ref 20–32)
Calcium: 10 mg/dL (ref 8.6–10.2)
Chloride: 105 mmol/L (ref 98–110)
Creat: 0.65 mg/dL (ref 0.50–0.96)
Globulin: 2.6 g/dL (calc) (ref 1.9–3.7)
Glucose, Bld: 103 mg/dL — ABNORMAL HIGH (ref 65–99)
Potassium: 4.1 mmol/L (ref 3.5–5.3)
Sodium: 139 mmol/L (ref 135–146)
Total Bilirubin: 0.7 mg/dL (ref 0.2–1.2)
Total Protein: 7.1 g/dL (ref 6.1–8.1)

## 2021-05-31 LAB — QUANTIFERON-TB GOLD PLUS
Mitogen-NIL: >10 IU/mL
NIL: 0.12 IU/mL
QuantiFERON-TB Gold Plus: NEGATIVE
TB1-NIL: <0 IU/mL
TB2-NIL: <0 IU/mL

## 2021-05-31 LAB — VITAMIN D 25 HYDROXY (VIT D DEFICIENCY, FRACTURES): Vit D, 25-Hydroxy: 28 ng/mL — ABNORMAL LOW (ref 30–100)

## 2021-05-31 LAB — TSH: TSH: 1.28 mIU/L

## 2021-05-31 LAB — T4, FREE: Free T4: 1.2 ng/dL (ref 0.8–1.8)

## 2021-08-06 ENCOUNTER — Other Ambulatory Visit: Payer: Self-pay | Admitting: Pediatrics

## 2021-08-06 DIAGNOSIS — N921 Excessive and frequent menstruation with irregular cycle: Secondary | ICD-10-CM

## 2022-01-14 ENCOUNTER — Encounter: Payer: Self-pay | Admitting: Pediatrics

## 2022-01-14 ENCOUNTER — Ambulatory Visit (INDEPENDENT_AMBULATORY_CARE_PROVIDER_SITE_OTHER): Payer: 59 | Admitting: Pediatrics

## 2022-01-14 ENCOUNTER — Other Ambulatory Visit (HOSPITAL_COMMUNITY)
Admission: RE | Admit: 2022-01-14 | Discharge: 2022-01-14 | Disposition: A | Discharge status: Home / Self Care | Payer: 59 | Source: Ambulatory Visit | Attending: Pediatrics | Admitting: Pediatrics

## 2022-01-14 VITALS — BP 124/82 | HR 72 | Ht 63.78 in | Wt 171.2 lb

## 2022-01-14 DIAGNOSIS — F4323 Adjustment disorder with mixed anxiety and depressed mood: Secondary | ICD-10-CM

## 2022-01-14 DIAGNOSIS — Z793 Long term (current) use of hormonal contraceptives: Secondary | ICD-10-CM

## 2022-01-14 DIAGNOSIS — N946 Dysmenorrhea, unspecified: Secondary | ICD-10-CM

## 2022-01-14 DIAGNOSIS — Z113 Encounter for screening for infections with a predominantly sexual mode of transmission: Secondary | ICD-10-CM | POA: Insufficient documentation

## 2022-01-14 DIAGNOSIS — I10 Essential (primary) hypertension: Secondary | ICD-10-CM

## 2022-01-14 MED ORDER — ESCITALOPRAM OXALATE 10 MG PO TABS: ORAL_TABLET | ORAL | 3 refills | Status: AC

## 2022-01-14 MED ORDER — LISINOPRIL 10 MG PO TABS: 10.0000 mg | ORAL_TABLET | Freq: Every day | ORAL | 1 refills | Status: AC

## 2022-01-14 NOTE — Patient Instructions (Addendum)
OBGYN options  ?Belva Agee- [facility]  ?Jennifer Rasch- [facility]  ? ?Start taking 1/2 tablet of lexapro daily. After 1 week, increase to 1 whole tablet  ?Let me know if concerns  ? ? ?

## 2022-01-14 NOTE — Progress Notes (Signed)
History was provided by the patient. ? ?Madison Schmitt is a 21 y.o. female who is here for adjustment disorder, hypertension, dysmenorrhea.  ?Alfonso Ramus T, FNP  ? 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 She is working at [employer] and working 3p-3a.  ? ?Was on period for 3 weeks last month. She is still taking OCP. She has missed some, sometimes 2 or more in a row. She will take 2 when she realizes it. Not currently sexually active.  ? ?Some anxiety and depression symptoms. Would like to get back in the gym but not feeling motivated. Just generally feeling overall very meh about life. Struggling with boundaries with living with parents but worries about moving out with friends and challenges of roommates, etc.  ? ?LMP today. ? ? ?  01/14/2022  ? 11:29 AM 04/24/2021  ? 10:00 AM 04/03/2019  ?  1:21 PM  ?PHQ-SADS Last 3 Score only  ?PHQ-15 Score 8 0 8  ?Total GAD-7 Score 9 0 15  ?PHQ Adolescent Score 7 0 7  ?  ? ?No LMP recorded. ? ? ?Patient Active Problem List  ? Diagnosis Date Noted  ? BMI 28.0-28.9,adult 04/23/2021  ? Vitamin D deficiency 04/23/2021  ? Adjustment disorder 01/20/2020  ? Hypertension 07/19/2019  ? Oral contraceptive pill surveillance 07/28/2014  ? Dysmenorrhea treated with oral contraceptive 07/28/2014  ? Headache 07/15/2014  ? ? ?Current Outpatient Medications on File Prior to Visit  ?Medication Sig Dispense Refill  ? AVIANE 0.1-20 MG-MCG tablet Take 1 tablet by mouth once daily 84 tablet 3  ? lisinopril (ZESTRIL) 10 MG tablet Take 1 tablet (10 mg total) by mouth daily. 90 tablet 1  ? ?No current facility-administered medications on file prior to visit.  ? ? ?Allergies  ?Allergen Reactions  ? Amoxicillin Hives  ? ? ?Physical Exam:  ?  ?Vitals:  ? 01/14/22 1022  ?BP: 124/82  ?Pulse: 72  ?Weight: 171 lb 3.2 oz (77.7 kg)  ?Height: 5' 3.78" (1.62 m)  ? ? ?Growth percentile SmartLinks can only be used for patients less than 61 years old. ? ?Physical Exam ?Vitals and nursing note reviewed.   ?Constitutional:   ?   General: She is not in acute distress. ?   Appearance: She is well-developed.  ?Neck:  ?   Thyroid: No thyromegaly.  ?Cardiovascular:  ?   Rate and Rhythm: Normal rate and regular rhythm.  ?   Heart sounds: No murmur heard. ?Pulmonary:  ?   Breath sounds: Normal breath sounds.  ?Abdominal:  ?   Palpations: Abdomen is soft. There is no mass.  ?   Tenderness: There is no abdominal tenderness. There is no guarding.  ?Musculoskeletal:  ?   Right lower leg: No edema.  ?   Left lower leg: No edema.  ?Lymphadenopathy:  ?   Cervical: No cervical adenopathy.  ?Skin: ?   General: Skin is warm.  ?   Findings: No rash.  ?Neurological:  ?   Mental Status: She is alert.  ?   Comments: No tremor  ?Psychiatric:     ?   Mood and Affect: Mood normal.  ? ? ?Assessment/Plan: ?1. Adjustment disorder with mixed anxiety and depressed mood ?Discussed options for mood symptoms including therapy, medication, both or nothing. Would like to trial a medication and see how she feels. Will start lexapro as below.  ?- escitalopram (LEXAPRO) 10 MG tablet; Take 0.5 tablets (5 mg total) by mouth daily for 7 days, THEN  1 tablet (10 mg total) daily for 23 days.  Dispense: 30 tablet; Refill: 3 ? ?2. Primary hypertension ?Continue lisinopril- better control today.  ?- lisinopril (ZESTRIL) 10 MG tablet; Take 1 tablet (10 mg total) by mouth daily.  Dispense: 90 tablet; Refill: 1 ? ?3. Dysmenorrhea treated with oral contraceptive ?Continue OCP. Discussed need to take daily and not miss doses to prevent BTB. Not currently sexually active.  ? ?4. Routine screening for STI (sexually transmitted infection) ?Per protocol.  ?- Urine cytology ancillary only ? ?Return in 3 weeks for virtual visit  ? ?Alfonso Ramus, FNP ? ?

## 2022-01-15 LAB — URINE CYTOLOGY ANCILLARY ONLY
Bacterial Vaginitis-Urine: NEGATIVE
Candida Urine: NEGATIVE
Chlamydia: NEGATIVE
Comment: NEGATIVE
Comment: NEGATIVE
Comment: NORMAL
Neisseria Gonorrhea: NEGATIVE
Trichomonas: NEGATIVE

## 2022-02-05 ENCOUNTER — Telehealth (INDEPENDENT_AMBULATORY_CARE_PROVIDER_SITE_OTHER): Payer: 59 | Admitting: Pediatrics

## 2022-02-05 DIAGNOSIS — F4323 Adjustment disorder with mixed anxiety and depressed mood: Secondary | ICD-10-CM | POA: Diagnosis not present

## 2022-02-05 NOTE — Progress Notes (Signed)
THIS RECORD MAY CONTAIN CONFIDENTIAL INFORMATION THAT SHOULD NOT BE RELEASED WITHOUT REVIEW OF THE SERVICE PROVIDER. ? ?Virtual Follow-Up Visit via Video Note ? ?I connected with Madison Schmitt 's patient  on 02/05/22 at  3:00 PM EDT by a video enabled telemedicine application and verified that I am speaking with the correct person using two identifiers.   ?Patient/parent location: Home ?  ?I discussed the limitations of evaluation and management by telemedicine and the availability of in person appointments.  I discussed that the purpose of this telehealth visit is to provide medical care while limiting exposure to the novel coronavirus.  The patient expressed understanding and agreed to proceed. ?  ?Madison Schmitt is a 21 y.o. female referred by Verneda Skill, FNP here today for follow-up of adjustment disorder. ? ?Previsit planning completed:  yes ? ? History was provided by the patient. ? ?Supervising Physician: Dr. Delorse Lek ? ?Plan from Last Visit:   ?Start lexapro 5-->10 mg  ? ?Chief Complaint: ?Med f/u ? ?History of Present Illness:  ?Pt reports her "entire life has gone to shambles" since the last visit. Her parents got into a huge argument and her mother subsequently moved out to her aunt's house. Started lexapro- was nauseous for 2 weeks but otherwise hasn't noticed a huge difference- though the recent family changes make it hard to tell. Maybe calmer overall. 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  ? ?Sleeping well but has changed to night shift for the month.  ? ? ?Allergies  ?Allergen Reactions  ? Amoxicillin Hives  ? ?Outpatient Medications Prior to Visit  ?Medication Sig Dispense Refill  ? AVIANE 0.1-20 MG-MCG tablet Take 1 tablet by mouth once daily 84 tablet 3  ? escitalopram (LEXAPRO) 10 MG tablet Take 0.5 tablets (5 mg total) by mouth daily for 7 days, THEN 1 tablet (10 mg total) daily for 23 days. 30 tablet 3  ?  lisinopril (ZESTRIL) 10 MG tablet Take 1 tablet (10 mg total) by mouth daily. 90 tablet 1  ? ?No facility-administered medications prior to visit.  ?  ? ?Patient Active Problem List  ? Diagnosis Date Noted  ? BMI 28.0-28.9,adult 04/23/2021  ? Vitamin D deficiency 04/23/2021  ? Adjustment disorder 01/20/2020  ? Hypertension 07/19/2019  ? Oral contraceptive pill surveillance 07/28/2014  ? Dysmenorrhea treated with oral contraceptive 07/28/2014  ? Headache 07/15/2014  ? ? ?The following portions of the patient's history were reviewed and updated as appropriate: allergies, current medications, past family history, past medical history, past social history, past surgical history, and problem list. ? ?Visual Observations/Objective:  ? ?General Appearance: Well nourished well developed, in no apparent distress.  ?Eyes: conjunctiva no swelling or erythema ?ENT/Mouth: No hoarseness, No cough for duration of visit.  ?Neck: Supple  ?Respiratory: Respiratory effort normal, normal rate, no retractions or distress.   ?Cardio: Appears well-perfused, noncyanotic ?Musculoskeletal: no obvious deformity ?Skin: visible skin without rashes, ecchymosis, erythema ?Neuro: Awake and oriented X 3,  ?Psych:  normal affect, Insight and Judgment appropriate.  ? ? ?Assessment/Plan: ?1. Adjustment disorder with mixed anxiety and depressed mood ?Continue lexapro 10 mg daily. Offered counseling services or ongoing support here as needed. Feels like she is ok right now, just making it through things with her parents.  ? ?  ? ?I discussed the assessment and treatment plan with the patient and/or parent/guardian.  ?They were provided an opportunity  to ask questions and all were answered.  ?They agreed with the plan and demonstrated an understanding of the instructions. ?They were advised to call back or seek an in-person evaluation in the emergency room if the symptoms worsen or if the condition fails to improve as anticipated. ? ? ?Follow-up:  4  weeks  ? ?I spent >15 minutes spent face to face with patient with more than 50% of appointment spent discussing diagnosis, management, follow-up, and reviewing of adjustment disorder and medication. I spent an additional 3 minutes on pre-and post-visit activities. I was located in clinic during this encounter. ? ? ?Alfonso Ramus, FNP  ? ? ?CC: Verneda Skill, FNP, Verneda Skill, FNP ? ? ? ?

## 2022-03-04 ENCOUNTER — Telehealth: Payer: 59 | Admitting: Pediatrics

## 2022-04-08 ENCOUNTER — Other Ambulatory Visit: Payer: Self-pay | Admitting: Family

## 2022-04-08 DIAGNOSIS — F4323 Adjustment disorder with mixed anxiety and depressed mood: Secondary | ICD-10-CM

## 2022-05-04 ENCOUNTER — Other Ambulatory Visit: Payer: Self-pay | Admitting: Pediatrics

## 2022-05-04 DIAGNOSIS — F4323 Adjustment disorder with mixed anxiety and depressed mood: Secondary | ICD-10-CM

## 2022-07-17 ENCOUNTER — Telehealth: Payer: Self-pay | Admitting: *Deleted

## 2022-07-17 NOTE — Telephone Encounter (Signed)
Barnetta Chapel at [facility] called the nurse line to request a refill for Hiral's Lisinopril.

## 2022-07-29 ENCOUNTER — Telehealth: Payer: Self-pay

## 2022-07-29 ENCOUNTER — Ambulatory Visit: Payer: Medicaid Other

## 2022-07-29 DIAGNOSIS — Z09 Encounter for follow-up examination after completed treatment for conditions other than malignant neoplasm: Secondary | ICD-10-CM

## 2022-07-29 NOTE — Telephone Encounter (Signed)
SWCM mailed notification of need to transition to adult primary care as pt has aged out of [facility] clinic. SWCM also called pt. No answer, left VM.    Shaune Pollack, BSW, QP Social Work Case Programmer, multimedia and [employer] [facility] Office: [phone] Direct Number: [phone]

## 2022-07-31 DIAGNOSIS — Z124 Encounter for screening for malignant neoplasm of cervix: Secondary | ICD-10-CM | POA: Diagnosis not present

## 2022-07-31 NOTE — Progress Notes (Signed)
CASE MANAGEMENT VISIT  Session Start time: 10:30am  Session End time: 10:45am Total time: 15 minutes  Type of Service:CASE MANAGEMENT Interpretor:No. Interpretor Name and Language:   Summary of Today's Visit: SWCM received call back from pt.  Pt stated that she has an appt on 07/31/22 with [facility] [place], and would ask if they can manage her contraceptive and other medications. SWCM explained to pt that she may also need to establish with an adult PCP to manage other areas of care (sick visits, etc.) Pt states understanding and states that she will call SWCM if she needs assistance with locating and scheduling with an adult PCP   Plan for Next Visit:  Pt can call if needing further assistance.     Shaune Pollack, BSW, QP Social Work Case Programmer, multimedia and [employer] [facility] Office: [phone] Direct Number: [phone]    Timoteo Ace

## 2022-09-12 DIAGNOSIS — U071 COVID-19: Secondary | ICD-10-CM | POA: Diagnosis not present

## 2022-09-12 DIAGNOSIS — R051 Acute cough: Secondary | ICD-10-CM | POA: Diagnosis not present

## 2022-09-12 DIAGNOSIS — J069 Acute upper respiratory infection, unspecified: Secondary | ICD-10-CM | POA: Diagnosis not present

## 2022-10-25 DIAGNOSIS — B3731 Acute candidiasis of vulva and vagina: Secondary | ICD-10-CM | POA: Diagnosis not present
